# Patient Record
Sex: Female | Born: 1995 | Race: White | Hispanic: No | Marital: Single | Smoking: Former smoker
Health system: Southern US, Community
[De-identification: ages and names within clinical notes are randomized; demographics above are authoritative.]

## PROBLEM LIST (undated history)

## (undated) DIAGNOSIS — N83209 Unspecified ovarian cyst, unspecified side: Secondary | ICD-10-CM

## (undated) HISTORY — DX: Unspecified ovarian cyst, unspecified side: N83.209

---
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
## 2016-05-15 ENCOUNTER — Emergency Department
Admission: EM | Admit: 2016-05-15 | Discharge: 2016-05-15 | Disposition: A | Discharge status: Home / Self Care | Payer: Self-pay | Attending: Emergency Medicine | Admitting: Emergency Medicine

## 2016-05-15 ENCOUNTER — Encounter: Payer: Self-pay | Admitting: Emergency Medicine

## 2016-05-15 ENCOUNTER — Emergency Department: Payer: Self-pay

## 2016-05-15 DIAGNOSIS — N3001 Acute cystitis with hematuria: Secondary | ICD-10-CM | POA: Insufficient documentation

## 2016-05-15 DIAGNOSIS — F17200 Nicotine dependence, unspecified, uncomplicated: Secondary | ICD-10-CM | POA: Insufficient documentation

## 2016-05-15 LAB — URINALYSIS, COMPLETE (UACMP) WITH MICROSCOPIC
Bilirubin Urine: NEGATIVE
Glucose, UA: NEGATIVE mg/dL
Ketones, ur: NEGATIVE mg/dL
Nitrite: NEGATIVE
PH: 5 (ref 5.0–8.0)
Protein, ur: 100 mg/dL — AB
SPECIFIC GRAVITY, URINE: 1.025 (ref 1.005–1.030)

## 2016-05-15 MED ORDER — PHENAZOPYRIDINE HCL 100 MG PO TABS: 100.0000 mg | ORAL_TABLET | Freq: Three times a day (TID) | ORAL | 0 refills | Status: DC | PRN

## 2016-05-15 MED ORDER — CEPHALEXIN 500 MG PO CAPS
500.0000 mg | ORAL_CAPSULE | Freq: Once | ORAL | Status: AC
  Administered 2016-05-15: 500 mg via ORAL
  Filled 2016-05-15: qty 1

## 2016-05-15 MED ORDER — PHENAZOPYRIDINE HCL 100 MG PO TABS
95.0000 mg | ORAL_TABLET | Freq: Once | ORAL | Status: AC
  Administered 2016-05-15: 100 mg via ORAL
  Filled 2016-05-15: qty 1

## 2016-05-15 MED ORDER — CEPHALEXIN 500 MG PO CAPS: 500.0000 mg | ORAL_CAPSULE | Freq: Two times a day (BID) | ORAL | 0 refills | Status: AC

## 2016-05-15 NOTE — ED Notes (Signed)

## 2016-05-15 NOTE — ED Provider Notes (Signed)
[facility] Emergency Department Provider Note   First MD Initiated Contact with Patient 05/15/16 [phone]     (approximate)  I have reviewed the triage vital signs and the nursing notes.   HISTORY  Chief Complaint Hematuria    HPI Donna Bryant is a 21 y.o. female presents with acute onset of dysuria and hematuria which started at 8 PM last night. Patient denies any fever or febrile on presentation temperature 98. Patient denies any vomiting or diarrhea. Patient denies any flank pain and lower abdominal.   Past medical history No pertinent past medical history There are no active problems to display for this patient.   Past Surgical History:  Procedure Laterality Date  . ABDOMINAL HYSTERECTOMY      Prior to Admission medications   Medication Sig Start Date End Date Taking? Authorizing Provider  cephALEXin (KEFLEX) 500 MG capsule Take 1 capsule (500 mg total) by mouth 2 (two) times daily. 05/15/16 05/25/16  Darci Current, MD  phenazopyridine (PYRIDIUM) 100 MG tablet Take 1 tablet (100 mg total) by mouth 3 (three) times daily as needed for pain. 05/15/16   Darci Current, MD    Allergies Patient has no known allergies.  No family history on file.  Social History Social History  Substance Use Topics  . Smoking status: Current Every Day Smoker  . Smokeless tobacco: Never Used  . Alcohol use No    Review of Systems Constitutional: No fever/chills Eyes: No visual changes. ENT: No sore throat. Cardiovascular: Denies chest pain. Respiratory: Denies shortness of breath. Gastrointestinal: No abdominal pain.  No nausea, no vomiting.  No diarrhea.  No constipation. Genitourinary: Positive for hematuria and dysuria Musculoskeletal: Negative for back pain. Skin: Negative for rash. Neurological: Negative for headaches, focal weakness or numbness.  10-point ROS otherwise negative.  ____________________________________________   PHYSICAL  EXAM:  VITAL SIGNS: ED Triage Vitals  Enc Vitals Group     BP 05/15/16 0219 109/68     Pulse Rate 05/15/16 0219 (!) 108     Resp 05/15/16 0219 18     Temp 05/15/16 0219 98 F (36.7 C)     Temp Source 05/15/16 0219 Oral     SpO2 05/15/16 0219 100 %     Weight 05/15/16 0214 140 lb (63.5 kg)     Height 05/15/16 0214 5\' 8"  (1.727 m)     Head Circumference --      Peak Flow --      Pain Score --      Pain Loc --      Pain Edu? --      Excl. in GC? --     Constitutional: Alert and oriented. Well appearing and in no acute distress. Eyes: Conjunctivae are normal. PERRL. EOMI. Head: Atraumatic. Neck: No stridor. Cardiovascular: Normal rate, regular rhythm. Good peripheral circulation. Grossly normal heart sounds. Respiratory: Normal respiratory effort.  No retractions. Lungs CTAB. Gastrointestinal: Soft and nontender. No distention.  Musculoskeletal: No lower extremity tenderness nor edema. No gross deformities of extremities. Neurologic:  Normal speech and language. No gross focal neurologic deficits are appreciated.  Skin:  Skin is warm, dry and intact. No rash noted. Psychiatric: Mood and affect are normal. Speech and behavior are normal.  ____________________________________________   LABS (all labs ordered are listed, but only abnormal results are displayed)  Labs Reviewed  URINALYSIS, COMPLETE (UACMP) WITH MICROSCOPIC - Abnormal; Notable for the following:       Result Value   Color, Urine AMBER (*)  APPearance CLOUDY (*)    Hgb urine dipstick LARGE (*)    Protein, ur 100 (*)    Leukocytes, UA MODERATE (*)    Bacteria, UA RARE (*)    Squamous Epithelial / LPF 0-5 (*)    All other components within normal limits     RADIOLOGY I, [place] N Juandedios Dudash, personally viewed and evaluated these images (plain radiographs) as part of my medical decision making, as well as reviewing the written report by the radiologist.  Dg Abdomen 1 View  Result Date: 05/15/2016 CLINICAL  DATA:  Nausea and vomiting EXAM: ABDOMEN - 1 VIEW COMPARISON:  None. FINDINGS: Nonobstructed bowel-gas pattern with moderate stool. No abnormal calcifications. IMPRESSION: Nonobstructed bowel-gas pattern Electronically Signed   By: Jasmine PangKim  Fujinaga M.D.   On: 05/15/2016 03:55     Procedures     INITIAL IMPRESSION / ASSESSMENT AND PLAN / ED COURSE  Pertinent labs & imaging results that were available during my care of the patient were reviewed by me and considered in my medical decision making (see chart for details).        ____________________________________________  FINAL CLINICAL IMPRESSION(S) / ED DIAGNOSES  Final diagnoses:  Acute cystitis with hematuria     MEDICATIONS GIVEN DURING THIS VISIT:  Medications  phenazopyridine (PYRIDIUM) tablet 100 mg (not administered)  cephALEXin (KEFLEX) capsule 500 mg (not administered)     NEW OUTPATIENT MEDICATIONS STARTED DURING THIS VISIT:  New Prescriptions   CEPHALEXIN (KEFLEX) 500 MG CAPSULE    Take 1 capsule (500 mg total) by mouth 2 (two) times daily.   PHENAZOPYRIDINE (PYRIDIUM) 100 MG TABLET    Take 1 tablet (100 mg total) by mouth 3 (three) times daily as needed for pain.    Modified Medications   No medications on file    Discontinued Medications   No medications on file     Note:  This document was prepared using Dragon voice recognition software and may include unintentional dictation errors.    Darci Currentandolph N Clara Herbison, MD 05/15/16 [phone]

## 2016-05-15 NOTE — ED Notes (Signed)
Pt. returned from XR. 

## 2016-05-15 NOTE — ED Triage Notes (Signed)
Patient ambulatory to triage with steady gait, without difficulty or distress noted; pt reports hematuria and dysuria since 845pm; denies abd/back pain

## 2017-12-24 ENCOUNTER — Emergency Department: Payer: Medicaid Other

## 2017-12-24 ENCOUNTER — Encounter: Payer: Self-pay | Admitting: Emergency Medicine

## 2017-12-24 ENCOUNTER — Other Ambulatory Visit: Payer: Self-pay

## 2017-12-24 DIAGNOSIS — Z87891 Personal history of nicotine dependence: Secondary | ICD-10-CM | POA: Diagnosis not present

## 2017-12-24 DIAGNOSIS — N83201 Unspecified ovarian cyst, right side: Secondary | ICD-10-CM | POA: Diagnosis not present

## 2017-12-24 DIAGNOSIS — R102 Pelvic and perineal pain: Secondary | ICD-10-CM | POA: Diagnosis present

## 2017-12-24 LAB — COMPREHENSIVE METABOLIC PANEL
ALBUMIN: 4.1 g/dL (ref 3.5–5.0)
ALT: 16 U/L (ref 0–44)
AST: 19 U/L (ref 15–41)
Alkaline Phosphatase: 59 U/L (ref 38–126)
Anion gap: 6 (ref 5–15)
BUN: 15 mg/dL (ref 6–20)
CHLORIDE: 108 mmol/L (ref 98–111)
CO2: 25 mmol/L (ref 22–32)
CREATININE: 0.87 mg/dL (ref 0.44–1.00)
Calcium: 8.9 mg/dL (ref 8.9–10.3)
GFR calc Af Amer: >60 mL/min (ref >60)
GLUCOSE: 94 mg/dL (ref 70–99)
POTASSIUM: 4 mmol/L (ref 3.5–5.1)
Sodium: 139 mmol/L (ref 135–145)
Total Bilirubin: 0.4 mg/dL (ref 0.3–1.2)
Total Protein: 6.7 g/dL (ref 6.5–8.1)

## 2017-12-24 LAB — URINALYSIS, COMPLETE (UACMP) WITH MICROSCOPIC
Bilirubin Urine: NEGATIVE
GLUCOSE, UA: NEGATIVE mg/dL
HGB URINE DIPSTICK: NEGATIVE
KETONES UR: NEGATIVE mg/dL
LEUKOCYTES UA: NEGATIVE
Nitrite: NEGATIVE
PROTEIN: NEGATIVE mg/dL
Specific Gravity, Urine: 1.02 (ref 1.005–1.030)
pH: 6 (ref 5.0–8.0)

## 2017-12-24 LAB — CBC WITH DIFFERENTIAL/PLATELET
BASOS ABS: 0 10*3/uL (ref 0–0.1)
Basophils Relative: 1 %
EOS PCT: 3 %
Eosinophils Absolute: 0.2 10*3/uL (ref 0–0.7)
HEMATOCRIT: 38.4 % (ref 35.0–47.0)
Hemoglobin: 13.2 g/dL (ref 12.0–16.0)
LYMPHS PCT: 22 %
Lymphs Abs: 1.6 10*3/uL (ref 1.0–3.6)
MCH: 31.2 pg (ref 26.0–34.0)
MCHC: 34.5 g/dL (ref 32.0–36.0)
MCV: 90.4 fL (ref 80.0–100.0)
Monocytes Absolute: 0.4 10*3/uL (ref 0.2–0.9)
Monocytes Relative: 6 %
Neutro Abs: 5.1 10*3/uL (ref 1.4–6.5)
Neutrophils Relative %: 68 %
PLATELETS: 199 10*3/uL (ref 150–440)
RBC: 4.25 MIL/uL (ref 3.80–5.20)
RDW: 13.5 % (ref 11.5–14.5)
WBC: 7.3 10*3/uL (ref 3.6–11.0)

## 2017-12-24 LAB — POCT PREGNANCY, URINE: PREG TEST UR: NEGATIVE

## 2017-12-24 NOTE — ED Triage Notes (Addendum)
Patient ambulatory to triage with steady gait, without difficulty or distress noted; pt reports pelvic pain tonight with no accomp symptoms; st recently sexual intercourse has been painful and pain began tonight after intercourse; denies vag bleeding or discharge however reports last several months has been having brief monthly periods despite having a partial hysterectomy; tenderness & grimacing with palpation to lower abd, pt reports increased pain with ambulation or movement

## 2017-12-25 ENCOUNTER — Emergency Department
Admission: EM | Admit: 2017-12-25 | Discharge: 2017-12-25 | Disposition: A | Discharge status: Home / Self Care | Payer: Medicaid Other | Attending: Emergency Medicine | Admitting: Emergency Medicine

## 2017-12-25 DIAGNOSIS — N83201 Unspecified ovarian cyst, right side: Secondary | ICD-10-CM

## 2017-12-25 DIAGNOSIS — R102 Pelvic and perineal pain: Secondary | ICD-10-CM

## 2017-12-25 LAB — CHLAMYDIA/NGC RT PCR (ARMC ONLY)
Chlamydia Tr: NOT DETECTED
N gonorrhoeae: NOT DETECTED

## 2017-12-25 LAB — WET PREP, GENITAL
CLUE CELLS WET PREP: NONE SEEN
SPERM: NONE SEEN
Trich, Wet Prep: NONE SEEN
Yeast Wet Prep HPF POC: NONE SEEN

## 2017-12-25 MED ORDER — OXYCODONE-ACETAMINOPHEN 5-325 MG PO TABS: 1.0000 | ORAL_TABLET | ORAL | 0 refills | Status: DC | PRN

## 2017-12-25 MED ORDER — OXYCODONE-ACETAMINOPHEN 5-325 MG PO TABS
1.0000 | ORAL_TABLET | Freq: Once | ORAL | Status: AC
  Administered 2017-12-25: 1 via ORAL
  Filled 2017-12-25: qty 1

## 2017-12-25 NOTE — ED Provider Notes (Signed)
[facility] Emergency Department Provider Note ___________________________   First MD Initiated Contact with Patient 12/25/17 [phone]     (approximate)  I have reviewed the triage vital signs and the nursing notes.   HISTORY  Chief Complaint No chief complaint on file.    HPI Donna Bryant is a 22 y.o. female with history of partial hysterectomy approximately 3 and half years ago presents to the emergency department with 8 out of 10 pelvic discomfort which began during sexual intercourse tonight.  Patient states that she has had painful sexual intercourse recently.  Patient denies any dysuria no vaginal discharge.  Patient does admit to monthly brief periods over the past several months despite the fact that she has had a partial hysterectomy.   Past medical history No chronic medical conditions.  There are no active problems to display for this patient.   Past Surgical History:  Procedure Laterality Date  . ABDOMINAL HYSTERECTOMY      Prior to Admission medications   Medication Sig Start Date End Date Taking? Authorizing Provider  phenazopyridine (PYRIDIUM) 100 MG tablet Take 1 tablet (100 mg total) by mouth 3 (three) times daily as needed for pain. 05/15/16   Darci Current, MD    Allergies No known drug allergies No family history on file.  Social History Social History   Tobacco Use  . Smoking status: Former Games developer  . Smokeless tobacco: Never Used  Substance Use Topics  . Alcohol use: No  . Drug use: Not on file    Review of Systems Constitutional: No fever/chills Eyes: No visual changes. ENT: No sore throat. Cardiovascular: Denies chest pain. Respiratory: Denies shortness of breath. Gastrointestinal: No abdominal pain.  No nausea, no vomiting.  No diarrhea.  No constipation. Genitourinary: Negative for dysuria.  Positive for pelvic pain Musculoskeletal: Negative for neck pain.  Negative for back pain. Integumentary: Negative for  rash. Neurological: Negative for headaches, focal weakness or numbness.  ____________________________________________   PHYSICAL EXAM:  VITAL SIGNS: ED Triage Vitals [12/24/17 2142]  Enc Vitals Group     BP 108/66     Pulse Rate 79     Resp 18     Temp 97.8 F (36.6 C)     Temp Source Oral     SpO2 100 %     Weight 63.5 kg (140 lb)     Height 1.727 m (5\' 8" )     Head Circumference      Peak Flow      Pain Score 6     Pain Loc      Pain Edu?      Excl. in GC?     Constitutional: Alert and oriented. Well appearing and in no acute distress. Eyes: Conjunctivae are normal. Head: Atraumatic. Mouth/Throat: Mucous membranes are moist.  Oropharynx non-erythematous. Neck: No stridor.   Cardiovascular: Normal rate, regular rhythm. Good peripheral circulation. Grossly normal heart sounds. Respiratory: Normal respiratory effort.  No retractions. Lungs CTAB. Gastrointestinal: Soft and nontender. No distention.  Musculoskeletal: No lower extremity tenderness nor edema. No gross deformities of extremities. Neurologic:  Normal speech and language. No gross focal neurologic deficits are appreciated.  Skin:  Skin is warm, dry and intact. No rash noted. Psychiatric: Mood and affect are normal. Speech and behavior are normal.  ____________________________________________   LABS (all labs ordered are listed, but only abnormal results are displayed)  Labs Reviewed  URINALYSIS, COMPLETE (UACMP) WITH MICROSCOPIC - Abnormal; Notable for the following components:  Result Value   Color, Urine YELLOW (*)    APPearance CLEAR (*)    Bacteria, UA RARE (*)    All other components within normal limits  CBC WITH DIFFERENTIAL/PLATELET  COMPREHENSIVE METABOLIC PANEL  POCT PREGNANCY, URINE     RADIOLOGY I, Raymond N Aurther Harlin, personally viewed and evaluated these images (plain radiographs) as part of my medical decision making, as well as reviewing the written report by the radiologist.  ED  MD interpretation: 3.9 cm right ovarian possible hemorrhagic cyst.  Official radiology report(s): US Pelvis Transvanginal Non-ob (tv Only)  Result Date: 12/24/2017 CLINICAL DATA:  Pelvic pain after intercourse EXAM: TRANSABDOMINAL AND TRANSVAGINAL ULTRASOUND OF PELVIS TECHNIQUE: Both transabdominal and transvaginal ultrasound examinations of the pelvis were performed. Transabdominal technique was performed for global imaging of the pelvis including uterus, ovaries, adnexal regions, and pelvic cul-de-sac. It was necessary to proceed with endovaginal exam following the transabdominal exam to visualize the adnexa. COMPARISON:  None FINDINGS: Uterus Status post hysterectomy Endometrium Status post hysterectomy Right ovary Measurements: 7.1 x 4.6 x 5.1 cm. Cysts in the right ovary measuring 3.9 x 3.6 x 3.7 cm and 3.4 x 2.9 x 3.7 cm. Flow present within the right ovary. Left ovary Measurements: 2.8 x 2 x 2 cm.  Normal appearance/no adnexal mass. Other findings Small amount of free fluid in the pelvis IMPRESSION: 1. Status post hysterectomy 2. Complex cysts in the right ovary measuring up to 3.9 cm, possibly hemorrhagic. Recommend 6-12 week sonographic follow-up to ensure resolution 3. Small amount of free fluid in the pelvis Electronically Signed   By: Jasmine Pang M.D.   On: 12/24/2017 23:33   US Pelvis Complete  Result Date: 12/24/2017 CLINICAL DATA:  Pelvic pain after intercourse EXAM: TRANSABDOMINAL AND TRANSVAGINAL ULTRASOUND OF PELVIS TECHNIQUE: Both transabdominal and transvaginal ultrasound examinations of the pelvis were performed. Transabdominal technique was performed for global imaging of the pelvis including uterus, ovaries, adnexal regions, and pelvic cul-de-sac. It was necessary to proceed with endovaginal exam following the transabdominal exam to visualize the adnexa. COMPARISON:  None FINDINGS: Uterus Status post hysterectomy Endometrium Status post hysterectomy Right ovary Measurements: 7.1 x  4.6 x 5.1 cm. Cysts in the right ovary measuring 3.9 x 3.6 x 3.7 cm and 3.4 x 2.9 x 3.7 cm. Flow present within the right ovary. Left ovary Measurements: 2.8 x 2 x 2 cm.  Normal appearance/no adnexal mass. Other findings Small amount of free fluid in the pelvis IMPRESSION: 1. Status post hysterectomy 2. Complex cysts in the right ovary measuring up to 3.9 cm, possibly hemorrhagic. Recommend 6-12 week sonographic follow-up to ensure resolution 3. Small amount of free fluid in the pelvis Electronically Signed   By: Jasmine Pang M.D.   On: 12/24/2017 23:33    ____________________  Procedures   ____________________________________________   INITIAL IMPRESSION / ASSESSMENT AND PLAN / ED COURSE  As part of my medical decision making, I reviewed the following data within the electronic MEDICAL RECORD NUMBER    22 year old female presenting with above-stated history and physical exam secondary to pelvic pain.  Pelvic ultrasound revealed evidence of an ovarian cyst that is possibly hemorrhagic.  Vaginal exam revealed no gross abnormality cultures obtained and pending.  Patient does have both of her children with her and is not willing to stay till vaginal swabs resulted.  Patient given [facility] emergency department will be prescribed same for home with recommendation to follow-up with Dr. Valentino Saxon. ____________________________________________  FINAL CLINICAL IMPRESSION(S) / ED DIAGNOSES  Final  diagnoses:  Right ovarian cyst     MEDICATIONS GIVEN DURING THIS VISIT:  Medications  oxyCODONE-acetaminophen (PERCOCET/ROXICET) 5-325 MG per tablet 1 tablet (1 tablet Oral Given 12/25/17 0053)     ED Discharge Orders    None       Note:  This document was prepared using Dragon voice recognition software and may include unintentional dictation errors.    Darci Current, MD 12/25/17 [phone]

## 2017-12-25 NOTE — ED Notes (Signed)
ED Provider at bedside. 

## 2017-12-25 NOTE — ED Notes (Signed)
Pt states she is feeling better.  Pt alert.  Family with pt

## 2017-12-25 NOTE — ED Notes (Signed)
Pelvic exam done  Specimen to lab.  

## 2017-12-31 ENCOUNTER — Ambulatory Visit (INDEPENDENT_AMBULATORY_CARE_PROVIDER_SITE_OTHER): Payer: Medicaid Other | Admitting: Obstetrics and Gynecology

## 2017-12-31 ENCOUNTER — Encounter: Payer: Self-pay | Admitting: Obstetrics and Gynecology

## 2017-12-31 VITALS — BP 103/67 | HR 66 | Ht 68.0 in | Wt 142.3 lb

## 2017-12-31 DIAGNOSIS — N83201 Unspecified ovarian cyst, right side: Secondary | ICD-10-CM | POA: Diagnosis not present

## 2017-12-31 DIAGNOSIS — R102 Pelvic and perineal pain: Secondary | ICD-10-CM

## 2017-12-31 DIAGNOSIS — N83209 Unspecified ovarian cyst, unspecified side: Secondary | ICD-10-CM

## 2017-12-31 NOTE — Progress Notes (Signed)
HPI:      [name] is a 22 y.o. G1P1 who LMP was No LMP recorded. Patient has had a hysterectomy.  Subjective:   She presents today for follow-up of an ovarian cyst which appears to be a hemorrhagic cyst (X2).  She has intermittent pelvic pain.  The pain is not disabling.  She has been taking Aleve which provides her with some relief. Significant history includes a history of cesarean hysterectomy.  The hysterectomy was supracervical and the patient has approximately 1 day of spotting per month. She has never had cyst like this before.    Hx: The following portions of the patient's history were reviewed and updated as appropriate:             She  has a past medical history of Ovarian cyst. She does not have a problem list on file. She  has a past surgical history that includes Abdominal hysterectomy (2016) and Cesarean section. Her family history is not on file. She  reports that she has quit smoking. She has never used smokeless tobacco. She reports that she drinks alcohol. She reports that she does not use drugs. She has a current medication list which includes the following prescription(s): oxycodone-acetaminophen. She has No Known Allergies.       Review of Systems:  Review of Systems  Constitutional: Denied constitutional symptoms, night sweats, recent illness, fatigue, fever, insomnia and weight loss.  Eyes: Denied eye symptoms, eye pain, photophobia, vision change and visual disturbance.  Ears/Nose/Throat/Neck: Denied ear, nose, throat or neck symptoms, hearing loss, nasal discharge, sinus congestion and sore throat.  Cardiovascular: Denied cardiovascular symptoms, arrhythmia, chest pain/pressure, edema, exercise intolerance, orthopnea and palpitations.  Respiratory: Denied pulmonary symptoms, asthma, pleuritic pain, productive sputum, cough, dyspnea and wheezing.  Gastrointestinal: Denied, gastro-esophageal reflux, melena, nausea and vomiting.  Genitourinary: See HPI for  additional information.  Musculoskeletal: Denied musculoskeletal symptoms, stiffness, swelling, muscle weakness and myalgia.  Dermatologic: Denied dermatology symptoms, rash and scar.  Neurologic: Denied neurology symptoms, dizziness, headache, neck pain and syncope.  Psychiatric: Denied psychiatric symptoms, anxiety and depression.  Endocrine: Denied endocrine symptoms including hot flashes and night sweats.   Meds:   Current Outpatient Medications on File Prior to Visit  Medication Sig Dispense Refill  . oxyCODONE-acetaminophen (PERCOCET) 5-325 MG tablet Take 1 tablet by mouth every 4 (four) hours as needed for severe pain. 20 tablet 0   No current facility-administered medications on file prior to visit.     Objective:     Vitals:   12/31/17 1536  BP: 103/67  Pulse: 66              Ultrasound results reviewed in detail with the patient.  Assessment:    G1P1 There are no active problems to display for this patient.    1. Pelvic pain in female   2. Cyst of ovary, unspecified laterality     Likely hemorrhagic cyst-benign.   Plan:            1.  Expectant management.  Expect resolution of the cysts.  Patient contact us if her pain worsens.  Follow-up ultrasound in 6 weeks.  I have discussed the possibility of OCPs for prevention of future cysts.  Patient will consider this option. Orders Orders Placed This Encounter  Procedures  . US PELVIS TRANSVANGINAL NON-OB (TV ONLY)    No orders of the defined types were placed in this encounter.     F/U  Return in about 6 weeks (  around 02/11/2018). I spent 22 minutes involved in the care of this patient of which greater than 50% was spent discussing ultrasound findings of ovarian cysts, different types of ovarian cysts including hemorrhagic, vitreous cyst, menstrual cycles despite hysterectomy, prevention of ovarian cyst with OCPs, follow-up for ovarian cysts, pain relief with ovarian cyst.  All questions answered.  Elonda Husky, M.D. 12/31/2017 4:06 PM

## 2018-01-20 ENCOUNTER — Telehealth: Payer: Self-pay | Admitting: Obstetrics and Gynecology

## 2018-01-20 NOTE — Telephone Encounter (Signed)
Th patient called and stated that she would like to have her nurse call her back in regards to sending a note to her employer. The patient did not disclose any other information, Please advise.

## 2018-01-20 NOTE — Telephone Encounter (Signed)
Returned Pt call. Pt requested a note to work authorizing her more frequent bathroom breaks during her work shifts due to her ovarian cyst. I complied and faxed a noted to her employer.

## 2018-02-11 ENCOUNTER — Ambulatory Visit (INDEPENDENT_AMBULATORY_CARE_PROVIDER_SITE_OTHER): Payer: Medicaid Other

## 2018-02-11 ENCOUNTER — Encounter: Payer: Self-pay | Admitting: Obstetrics and Gynecology

## 2018-02-11 ENCOUNTER — Ambulatory Visit (INDEPENDENT_AMBULATORY_CARE_PROVIDER_SITE_OTHER): Payer: Medicaid Other | Admitting: Obstetrics and Gynecology

## 2018-02-11 ENCOUNTER — Other Ambulatory Visit: Payer: Medicaid Other

## 2018-02-11 ENCOUNTER — Encounter: Payer: Medicaid Other | Admitting: Obstetrics and Gynecology

## 2018-02-11 VITALS — BP 109/68 | HR 102 | Ht 68.0 in | Wt 146.0 lb

## 2018-02-11 DIAGNOSIS — N83209 Unspecified ovarian cyst, unspecified side: Secondary | ICD-10-CM | POA: Diagnosis not present

## 2018-02-11 DIAGNOSIS — N83201 Unspecified ovarian cyst, right side: Secondary | ICD-10-CM | POA: Diagnosis not present

## 2018-02-11 DIAGNOSIS — Z30011 Encounter for initial prescription of contraceptive pills: Secondary | ICD-10-CM | POA: Diagnosis not present

## 2018-02-11 DIAGNOSIS — R102 Pelvic and perineal pain: Secondary | ICD-10-CM | POA: Diagnosis not present

## 2018-02-11 MED ORDER — LEVONORGEST-ETH ESTRAD 91-DAY 0.15-0.03 &0.01 MG PO TABS
1.0000 | ORAL_TABLET | Freq: Every day | ORAL | 1 refills | Status: AC
Start: 2018-02-11 — End: ?

## 2018-02-11 NOTE — Progress Notes (Signed)
Pt presents today to discuss ovarian cyst after ultrasound.

## 2018-02-11 NOTE — Progress Notes (Signed)
HPI:      Ms. Donna DeutscherReba Bryant is a 22 y.o. G1P1 who LMP was No LMP recorded. Patient has had a hysterectomy.  Subjective:   She presents today for follow-up of ovarian cyst.  She reports that her pain has improved although she continues to have some pain with bowel movements urination and intercourse it is not nearly as bad as it used to be. Of significant note patient has previously had a supracervical hysterectomy. She had an ultrasound performed today.    Hx: The following portions of the patient's history were reviewed and updated as appropriate:             She  has a past medical history of Ovarian cyst. She does not have a problem list on file. She  has a past surgical history that includes Abdominal hysterectomy (2016) and Cesarean section. Her family history is not on file. She  reports that she has quit smoking. She has never used smokeless tobacco. She reports that she drinks alcohol. She reports that she does not use drugs. She has a current medication list which includes the following prescription(s): levonorgestrel-ethinyl estradiol. She has No Known Allergies.       Review of Systems:  Review of Systems  Constitutional: Denied constitutional symptoms, night sweats, recent illness, fatigue, fever, insomnia and weight loss.  Eyes: Denied eye symptoms, eye pain, photophobia, vision change and visual disturbance.  Ears/Nose/Throat/Neck: Denied ear, nose, throat or neck symptoms, hearing loss, nasal discharge, sinus congestion and sore throat.  Cardiovascular: Denied cardiovascular symptoms, arrhythmia, chest pain/pressure, edema, exercise intolerance, orthopnea and palpitations.  Respiratory: Denied pulmonary symptoms, asthma, pleuritic pain, productive sputum, cough, dyspnea and wheezing.  Gastrointestinal: Denied, gastro-esophageal reflux, melena, nausea and vomiting.  Genitourinary: See HPI for additional information.  Musculoskeletal: Denied musculoskeletal symptoms,  stiffness, swelling, muscle weakness and myalgia.  Dermatologic: Denied dermatology symptoms, rash and scar.  Neurologic: Denied neurology symptoms, dizziness, headache, neck pain and syncope.  Psychiatric: Denied psychiatric symptoms, anxiety and depression.  Endocrine: Denied endocrine symptoms including hot flashes and night sweats.   Meds:   No current outpatient medications on file prior to visit.   No current facility-administered medications on file prior to visit.     Objective:     Vitals:   02/11/18 1032  BP: 109/68  Pulse: (!) 102              Ultrasound results reviewed directly with the patient.  See report  Assessment:    G1P1 There are no active problems to display for this patient.    1. Pelvic pain in female   2. Cyst of ovary, unspecified laterality   3. Initiation of OCP (BCP)     Pain is improving, cyst is decreasing in size thus effectively ruling out benign ovarian tumors and ovarian malignancy.  The primary diagnosis remains hemorrhagic ovarian cyst slowly resolving.  Patient would like to start OCPs to prevent any future ovarian cyst.   Plan:            1.  Expectant management of her pain and the cyst.  I expect complete resolution.  The cyst is quite small at this time.  2.  OCPs The risks /benefits of OCPs have been explained to the patient in detail.  Product literature has been given to her.  I have instructed her in the use of OCPs and have given her literature reinforcing this information.  I have explained to the patient that OCPs are not as  effective for birth control during the first month of use, and that another form of contraception should be used during this time.  Both first-day start and Sunday start have been explained.  The risks and benefits of each was discussed.  She has been made aware of  the fact that other medications may affect the efficacy of OCPs.  I have answered all of her questions, and I believe that she has an  understanding of the effectiveness and use of OCPs.  Orders No orders of the defined types were placed in this encounter.    Meds ordered this encounter  Medications  . Levonorgestrel-Ethinyl Estradiol (AMETHIA,CAMRESE) 0.15-0.03 &0.01 MG tablet    Sig: Take 1 tablet by mouth at bedtime.    Dispense:  84 tablet    Refill:  1      F/U  Return in about 3 months (around 05/14/2018).  Elonda Husky, M.D. 02/11/2018 11:43 AM

## 2018-03-02 ENCOUNTER — Other Ambulatory Visit: Payer: Self-pay

## 2018-03-02 ENCOUNTER — Emergency Department
Admission: EM | Admit: 2018-03-02 | Discharge: 2018-03-02 | Disposition: A | Discharge status: Home / Self Care | Payer: Medicaid Other | Attending: Emergency Medicine | Admitting: Emergency Medicine

## 2018-03-02 DIAGNOSIS — K5289 Other specified noninfective gastroenteritis and colitis: Secondary | ICD-10-CM | POA: Insufficient documentation

## 2018-03-02 DIAGNOSIS — K529 Noninfective gastroenteritis and colitis, unspecified: Secondary | ICD-10-CM

## 2018-03-02 DIAGNOSIS — Z87891 Personal history of nicotine dependence: Secondary | ICD-10-CM | POA: Insufficient documentation

## 2018-03-02 DIAGNOSIS — R112 Nausea with vomiting, unspecified: Secondary | ICD-10-CM | POA: Diagnosis present

## 2018-03-02 LAB — CBC
HEMATOCRIT: 36.7 % (ref 36.0–46.0)
HEMOGLOBIN: 12.1 g/dL (ref 12.0–15.0)
MCH: 30 pg (ref 26.0–34.0)
MCHC: 33 g/dL (ref 30.0–36.0)
MCV: 90.8 fL (ref 80.0–100.0)
Platelets: 209 10*3/uL (ref 150–400)
RBC: 4.04 MIL/uL (ref 3.87–5.11)
RDW: 12.4 % (ref 11.5–15.5)
WBC: 7.9 10*3/uL (ref 4.0–10.5)
nRBC: 0 % (ref 0.0–0.2)

## 2018-03-02 LAB — URINALYSIS, COMPLETE (UACMP) WITH MICROSCOPIC
BILIRUBIN URINE: NEGATIVE
Glucose, UA: NEGATIVE mg/dL
HGB URINE DIPSTICK: NEGATIVE
KETONES UR: 5 mg/dL — AB
NITRITE: NEGATIVE
PROTEIN: 100 mg/dL — AB
Specific Gravity, Urine: 1.02 (ref 1.005–1.030)
pH: 5 (ref 5.0–8.0)

## 2018-03-02 LAB — POCT PREGNANCY, URINE: PREG TEST UR: NEGATIVE

## 2018-03-02 LAB — COMPREHENSIVE METABOLIC PANEL
ALBUMIN: 3.6 g/dL (ref 3.5–5.0)
ALT: 18 U/L (ref 0–44)
AST: 18 U/L (ref 15–41)
Alkaline Phosphatase: 61 U/L (ref 38–126)
Anion gap: 10 (ref 5–15)
BILIRUBIN TOTAL: 0.5 mg/dL (ref 0.3–1.2)
BUN: 10 mg/dL (ref 6–20)
CHLORIDE: 98 mmol/L (ref 98–111)
CO2: 26 mmol/L (ref 22–32)
Calcium: 8.8 mg/dL — ABNORMAL LOW (ref 8.9–10.3)
Creatinine, Ser: 0.92 mg/dL (ref 0.44–1.00)
GFR calc Af Amer: >60 mL/min (ref >60)
GFR calc non Af Amer: >60 mL/min (ref >60)
GLUCOSE: 110 mg/dL — AB (ref 70–99)
POTASSIUM: 3 mmol/L — AB (ref 3.5–5.1)
SODIUM: 134 mmol/L — AB (ref 135–145)
Total Protein: 7.5 g/dL (ref 6.5–8.1)

## 2018-03-02 LAB — LIPASE, BLOOD: LIPASE: 29 U/L (ref 11–51)

## 2018-03-02 MED ORDER — ONDANSETRON 4 MG PO TBDP: 4.0000 mg | ORAL_TABLET | Freq: Three times a day (TID) | ORAL | 0 refills | Status: AC | PRN

## 2018-03-02 MED ORDER — ONDANSETRON HCL 4 MG/2ML IJ SOLN
4.0000 mg | Freq: Once | INTRAMUSCULAR | Status: AC
  Administered 2018-03-02: 4 mg via INTRAVENOUS
  Filled 2018-03-02: qty 2

## 2018-03-02 MED ORDER — SODIUM CHLORIDE 0.9 % IV SOLN
1000.0000 mL | Freq: Once | INTRAVENOUS | Status: AC
  Administered 2018-03-02: 1000 mL via INTRAVENOUS

## 2018-03-02 NOTE — ED Triage Notes (Addendum)
Pt arrives to ED via POV from home with c/o fever and vomiting x5 days. Pt denies CP or SHOB, no abdominal pain. Pt reports 3 episodes of emesis and 2 episodes of diarrhea over the last 24 hrs, last temp at home was 100 degrees with temporal thermometer. Last dose of Ibuprofen taken this morning, but unsure of exact time.

## 2018-03-02 NOTE — ED Provider Notes (Signed)
[facility] Emergency Department Provider Note   ____________________________________________    I have reviewed the triage vital signs and the nursing notes.   HISTORY  Chief Complaint Emesis and Fever     HPI Donna Bryant is a 22 y.o. female complains of nausea vomiting diarrhea as well as fever over the last 2 to 3 days.  She reports intermittent abdominal cramping but her primary complaint is nausea and vomiting.  She reports she has not been able to tolerate p.o.'s.  Denies sick contacts.  No recent travel.  Has not taken anything for this.  Past Medical History:  Diagnosis Date  . Ovarian cyst     There are no active problems to display for this patient.   Past Surgical History:  Procedure Laterality Date  . ABDOMINAL HYSTERECTOMY  2016  . CESAREAN SECTION      Prior to Admission medications   Medication Sig Start Date End Date Taking? Authorizing Provider  Levonorgestrel-Ethinyl Estradiol (AMETHIA,CAMRESE) 0.15-0.03 &0.01 MG tablet Take 1 tablet by mouth at bedtime. 02/11/18   Linzie Collin, MD  ondansetron (ZOFRAN ODT) 4 MG disintegrating tablet Take 1 tablet (4 mg total) by mouth every 8 (eight) hours as needed for nausea or vomiting. 03/02/18   Jene Every, MD     Allergies Patient has no known allergies.  Family History  Problem Relation Age of Onset  . Breast cancer Neg Hx   . Ovarian cancer Neg Hx   . Colon cancer Neg Hx     Social History Social History   Tobacco Use  . Smoking status: Former Games developer  . Smokeless tobacco: Never Used  Substance Use Topics  . Alcohol use: Yes    Comment: occas  . Drug use: Never    Review of Systems  Constitutional: No fever/chills Eyes: No visual changes.  ENT: No sore throat. Cardiovascular: Denies chest pain. Respiratory: Denies shortness of breath. Gastrointestinal: As above Genitourinary: Negative for dysuria. Musculoskeletal: Negative for back pain. Skin:  Negative for rash. Neurological: Negative for headaches   ____________________________________________   PHYSICAL EXAM:  VITAL SIGNS: ED Triage Vitals  Enc Vitals Group     BP 03/02/18 2127 (!) 99/58     Pulse Rate 03/02/18 2127 100     Resp 03/02/18 2127 16     Temp 03/02/18 2127 99.5 F (37.5 C)     Temp Source 03/02/18 2127 Oral     SpO2 03/02/18 2127 100 %     Weight 03/02/18 2125 63.5 kg (140 lb)     Height 03/02/18 2125 1.727 m (5\' 8" )     Head Circumference --      Peak Flow --      Pain Score 03/02/18 2125 6     Pain Loc --      Pain Edu? --      Excl. in GC? --     Constitutional: Alert and oriented. No acute distress. Pleasant and interactive Eyes: Conjunctivae are normal.  .Nose: No congestion/rhinnorhea. Mouth/Throat: Mucous membranes are moist.    Cardiovascular: Normal rate, regular rhythm. Grossly normal heart sounds.  Good peripheral circulation. Respiratory: Normal respiratory effort.  No retractions. Lungs CTAB. Gastrointestinal: Soft and nontender. No distention.  No CVA tenderness.  Musculoskeletal: Warm and well perfused Neurologic:  Normal speech and language. No gross focal neurologic deficits are appreciated.  Skin:  Skin is warm, dry and intact. No rash noted. Psychiatric: Mood and affect are normal. Speech and behavior are normal.  ____________________________________________   LABS (all labs ordered are listed, but only abnormal results are displayed)  Labs Reviewed  COMPREHENSIVE METABOLIC PANEL - Abnormal; Notable for the following components:      Result Value   Sodium 134 (*)    Potassium 3.0 (*)    Glucose, Bld 110 (*)    Calcium 8.8 (*)    All other components within normal limits  URINALYSIS, COMPLETE (UACMP) WITH MICROSCOPIC - Abnormal; Notable for the following components:   Color, Urine AMBER (*)    APPearance HAZY (*)    Ketones, ur 5 (*)    Protein, ur 100 (*)    Leukocytes, UA SMALL (*)    WBC, UA >50 (*)     Bacteria, UA RARE (*)    All other components within normal limits  LIPASE, BLOOD  CBC  POC URINE PREG, ED  POCT PREGNANCY, URINE   ____________________________________________  EKG  None ____________________________________________  RADIOLOGY  None ____________________________________________   PROCEDURES  Procedure(s) performed: No  Procedures   Critical Care performed: No ____________________________________________   INITIAL IMPRESSION / ASSESSMENT AND PLAN / ED COURSE  Pertinent labs & imaging results that were available during my care of the patient were reviewed by me and considered in my medical decision making (see chart for details).  Patient presents with nausea vomiting diarrhea highly suspicious for viral gastroenteritis.  Abdominal exam is reassuring.  Lab work is overall unremarkable, will treat with IV fluids, IV Zofran and reevaluate.   ----------------------------------------- 11:03 PM on 03/02/2018 -----------------------------------------  Patient tolerating p.o. now, feels better, appropriate for discharge with outpatient follow-up.  Return precautions discussed   ____________________________________________   FINAL CLINICAL IMPRESSION(S) / ED DIAGNOSES  Final diagnoses:  Gastroenteritis        Note:  This document was prepared using Dragon voice recognition software and may include unintentional dictation errors.    Jene EveryKinner, Antone Summons, MD 03/02/18 2303

## 2018-04-05 ENCOUNTER — Emergency Department (HOSPITAL_COMMUNITY): Payer: Medicaid Other

## 2018-04-05 ENCOUNTER — Emergency Department (HOSPITAL_COMMUNITY)
Admission: EM | Admit: 2018-04-05 | Discharge: 2018-04-05 | Disposition: A | Discharge status: Home / Self Care | Payer: Medicaid Other | Attending: Emergency Medicine | Admitting: Emergency Medicine

## 2018-04-05 ENCOUNTER — Encounter (HOSPITAL_COMMUNITY): Payer: Self-pay

## 2018-04-05 DIAGNOSIS — S42334A Nondisplaced oblique fracture of shaft of humerus, right arm, initial encounter for closed fracture: Secondary | ICD-10-CM | POA: Insufficient documentation

## 2018-04-05 DIAGNOSIS — Y929 Unspecified place or not applicable: Secondary | ICD-10-CM | POA: Insufficient documentation

## 2018-04-05 DIAGNOSIS — Y998 Other external cause status: Secondary | ICD-10-CM | POA: Insufficient documentation

## 2018-04-05 DIAGNOSIS — Z87891 Personal history of nicotine dependence: Secondary | ICD-10-CM | POA: Insufficient documentation

## 2018-04-05 DIAGNOSIS — Y939 Activity, unspecified: Secondary | ICD-10-CM | POA: Diagnosis not present

## 2018-04-05 DIAGNOSIS — Z79899 Other long term (current) drug therapy: Secondary | ICD-10-CM | POA: Diagnosis not present

## 2018-04-05 DIAGNOSIS — W010XXA Fall on same level from slipping, tripping and stumbling without subsequent striking against object, initial encounter: Secondary | ICD-10-CM | POA: Diagnosis not present

## 2018-04-05 DIAGNOSIS — S4991XA Unspecified injury of right shoulder and upper arm, initial encounter: Secondary | ICD-10-CM | POA: Diagnosis present

## 2018-04-05 MED ORDER — HYDROMORPHONE HCL 1 MG/ML IJ SOLN
1.0000 mg | Freq: Once | INTRAMUSCULAR | Status: AC
  Administered 2018-04-05: 1 mg via INTRAVENOUS
  Filled 2018-04-05: qty 1

## 2018-04-05 MED ORDER — FENTANYL CITRATE (PF) 100 MCG/2ML IJ SOLN
50.0000 ug | INTRAMUSCULAR | Status: DC | PRN
  Administered 2018-04-05: 50 ug via INTRAVENOUS
  Filled 2018-04-05: qty 2

## 2018-04-05 MED ORDER — HYDROCODONE-ACETAMINOPHEN 5-325 MG PO TABS: 1.0000 | ORAL_TABLET | ORAL | 0 refills | Status: AC | PRN

## 2018-04-05 MED ORDER — ONDANSETRON HCL 4 MG/2ML IJ SOLN
4.0000 mg | Freq: Once | INTRAMUSCULAR | Status: AC
  Administered 2018-04-05: 4 mg via INTRAVENOUS
  Filled 2018-04-05: qty 2

## 2018-04-05 NOTE — ED Triage Notes (Signed)
Pt states that she slipped and fell onto R arm, deformity to R humerus. Positive PMS.

## 2018-04-05 NOTE — ED Provider Notes (Signed)
[facility] EMERGENCY DEPARTMENT Provider Note   CSN: [id number] Arrival date & time: 04/05/18  0132     History   Chief Complaint Chief Complaint  Patient presents with  . Arm Pain    HPI Donna Bryant is a 23 y.o. female.  Patient presents to the emergency department for evaluation of right arm injury.  Patient reports that she slipped on a wet spot and fell.  She reports that she tried to catch herself with her right arm, injuring it.  Patient complaining of severe pain of the right upper arm.  She did not hit her head or lose consciousness.  No neck or back pain.     Past Medical History:  Diagnosis Date  . Ovarian cyst     There are no active problems to display for this patient.   Past Surgical History:  Procedure Laterality Date  . ABDOMINAL HYSTERECTOMY  2016  . CESAREAN SECTION       OB History    Gravida  1   Para  1   Term      Preterm      AB      Living  1     SAB      TAB      Ectopic      Multiple      Live Births  1            Home Medications    Prior to Admission medications   Medication Sig Start Date End Date Taking? Authorizing Provider  Levonorgestrel-Ethinyl Estradiol (AMETHIA,CAMRESE) 0.15-0.03 &0.01 MG tablet Take 1 tablet by mouth at bedtime. 02/11/18  Yes Linzie Collin, MD  HYDROcodone-acetaminophen (NORCO/VICODIN) 5-325 MG tablet Take 1-2 tablets by mouth every 4 (four) hours as needed for moderate pain. 04/05/18   Gilda Crease, MD  ondansetron (ZOFRAN ODT) 4 MG disintegrating tablet Take 1 tablet (4 mg total) by mouth every 8 (eight) hours as needed for nausea or vomiting. Patient not taking: Reported on 04/05/2018 03/02/18   Jene Every, MD    Family History Family History  Problem Relation Age of Onset  . Breast cancer Neg Hx   . Ovarian cancer Neg Hx   . Colon cancer Neg Hx     Social History Social History   Tobacco Use  . Smoking status: Former Games developer  .  Smokeless tobacco: Never Used  Substance Use Topics  . Alcohol use: Yes    Comment: occas  . Drug use: Never     Allergies   Patient has no known allergies.   Review of Systems Review of Systems  Musculoskeletal:       Arm pain  Neurological: Negative for syncope.     Physical Exam Updated Vital Signs BP 130/84   Pulse 93   Temp 98.2 F (36.8 C) (Oral)   Resp (!) 22   Ht 5\' 8"  (1.727 m)   Wt 65.8 kg   SpO2 100%   BMI 22.05 kg/m   Physical Exam Vitals signs and nursing note reviewed.  Constitutional:      Appearance: Normal appearance.  HENT:     Head: Normocephalic and atraumatic.  Eyes:     Pupils: Pupils are equal, round, and reactive to light.  Neck:     Musculoskeletal: Normal range of motion. No muscular tenderness.  Cardiovascular:     Rate and Rhythm: Normal rate and regular rhythm.     Pulses:  Radial pulses are 2+ on the right side.     Heart sounds: Normal heart sounds.  Pulmonary:     Effort: Pulmonary effort is normal.     Breath sounds: Normal breath sounds.  Abdominal:     Palpations: Abdomen is soft.  Musculoskeletal:     Right elbow: Normal.    Right wrist: Normal.     Right upper arm: She exhibits tenderness and deformity.  Skin:    General: Skin is warm and dry.     Capillary Refill: Capillary refill takes less than 2 seconds.  Neurological:     General: No focal deficit present.     Mental Status: She is alert and oriented to person, place, and time.     Cranial Nerves: No cranial nerve deficit.     Sensory: No sensory deficit.     Comments: Normal extension of fingers and wrist, normal grip strength, normal abduction, abduction, opposition of fingers      ED Treatments / Results  Labs (all labs ordered are listed, but only abnormal results are displayed) Labs Reviewed - No data to display  EKG None  Radiology Dg Humerus Right  Result Date: 04/05/2018 CLINICAL DATA:  Slip and fall tonight. Right upper arm pain  and deformity. EXAM: RIGHT HUMERUS - 2+ VIEW COMPARISON:  None. FINDINGS: Oblique fracture of the midshaft right humerus with lateral angulation of the distal fracture fragments. Soft tissue swelling. No destructive or expansile bone lesions appreciated. IMPRESSION: Oblique fracture midshaft right humerus with mild lateral angulation of the distal fracture fragments. Electronically Signed   By: Burman Nieves M.D.   On: 04/05/2018 02:18    Procedures Procedures (including critical care time)  Medications Ordered in ED Medications  fentaNYL (SUBLIMAZE) injection 50 mcg (50 mcg Intravenous Given 04/05/18 0155)  HYDROmorphone (DILAUDID) injection 1 mg (1 mg Intravenous Given 04/05/18 0300)  ondansetron (ZOFRAN) injection 4 mg (4 mg Intravenous Given 04/05/18 0300)     Initial Impression / Assessment and Plan / ED Course  I have reviewed the triage vital signs and the nursing notes.  Pertinent labs & imaging results that were available during my care of the patient were reviewed by me and considered in my medical decision making (see chart for details).     Patient presents to the emergency department for evaluation of right arm injury.  Patient had a fall earlier tonight onto an outstretched arm.  Patient suffered midshaft fracture of the right humerus.  Fracture is closed, she is neurovascularly intact.  Discussed with Dr. Susa Simmonds, on-call for orthopedics.  Ortho tech did not have the requested splint, therefore patient was placed in a long-arm splint and sling.  She was in a great deal of pain and this did help minimize the pain.  Final Clinical Impressions(s) / ED Diagnoses   Final diagnoses:  Closed nondisplaced oblique fracture of shaft of right humerus, initial encounter    ED Discharge Orders         Ordered    HYDROcodone-acetaminophen (NORCO/VICODIN) 5-325 MG tablet  Every 4 hours PRN     04/05/18 0414           Gilda Crease, MD 04/05/18 [phone]

## 2018-04-07 ENCOUNTER — Encounter: Payer: Self-pay | Admitting: Medical Oncology

## 2018-04-07 ENCOUNTER — Emergency Department
Admission: EM | Admit: 2018-04-07 | Discharge: 2018-04-07 | Disposition: A | Discharge status: Home / Self Care | Payer: Medicaid Other | Attending: Emergency Medicine | Admitting: Emergency Medicine

## 2018-04-07 DIAGNOSIS — S4991XA Unspecified injury of right shoulder and upper arm, initial encounter: Secondary | ICD-10-CM | POA: Diagnosis present

## 2018-04-07 DIAGNOSIS — Y929 Unspecified place or not applicable: Secondary | ICD-10-CM | POA: Diagnosis not present

## 2018-04-07 DIAGNOSIS — Y9301 Activity, walking, marching and hiking: Secondary | ICD-10-CM | POA: Diagnosis not present

## 2018-04-07 DIAGNOSIS — Y999 Unspecified external cause status: Secondary | ICD-10-CM | POA: Diagnosis not present

## 2018-04-07 DIAGNOSIS — Z87891 Personal history of nicotine dependence: Secondary | ICD-10-CM | POA: Diagnosis not present

## 2018-04-07 DIAGNOSIS — S42201A Unspecified fracture of upper end of right humerus, initial encounter for closed fracture: Secondary | ICD-10-CM | POA: Diagnosis not present

## 2018-04-07 DIAGNOSIS — Z79899 Other long term (current) drug therapy: Secondary | ICD-10-CM | POA: Diagnosis not present

## 2018-04-07 DIAGNOSIS — W0110XA Fall on same level from slipping, tripping and stumbling with subsequent striking against unspecified object, initial encounter: Secondary | ICD-10-CM | POA: Diagnosis not present

## 2018-04-07 MED ORDER — BACITRACIN-NEOMYCIN-POLYMYXIN 400-5-5000 EX OINT
TOPICAL_OINTMENT | Freq: Once | CUTANEOUS | Status: AC
  Administered 2018-04-07: 09:00:00 via TOPICAL
  Filled 2018-04-07: qty 1

## 2018-04-07 NOTE — ED Notes (Addendum)
See triage no states she seen on Sunday at [facility] with right arm injury  Was told she had a fx humerus  Was splinted at that time  Now having some swelling  Unable to see f/u ortho until next week  Ace wrap removed  Swelling noted at elbow  Also small abrasion

## 2018-04-07 NOTE — ED Triage Notes (Signed)
Pt reports broken arm Sunday, has splint on but reports fingers swelling. Was told to return for swelling. Pt has good color to hand/fingers, pulse present and sensation good.

## 2018-04-07 NOTE — ED Provider Notes (Signed)
[facility] Emergency Department Provider Note   ____________________________________________   First MD Initiated Contact with Patient 04/07/18 [phone]     (approximate)  I have reviewed the triage vital signs and the nursing notes.   HISTORY  Chief Complaint Arm Pain    HPI Lady DeutscherReba Bryant is a [age] y.o. female patient complain of pain and swelling to right humerus status post splint application 2 days ago.  Patient with diagnosis with a fracture of the right humerus secondary to a slip and fall 2 days ago.  Patient was seen at [facility] emergency room placed in a splint.  Patient is scheduled to see orthopedics next week.  Patient rates the pain as a 5/10.  Patient described pain is "achy".  Patient denies loss of sensation.  Past Medical History:  Diagnosis Date  . Ovarian cyst     There are no active problems to display for this patient.   Past Surgical History:  Procedure Laterality Date  . ABDOMINAL HYSTERECTOMY  2016  . CESAREAN SECTION      Prior to Admission medications   Medication Sig Start Date End Date Taking? Authorizing Provider  HYDROcodone-acetaminophen (NORCO/VICODIN) 5-325 MG tablet Take 1-2 tablets by mouth every 4 (four) hours as needed for moderate pain. 04/05/18   Gilda CreasePollina, Christopher J, MD  Levonorgestrel-Ethinyl Estradiol (AMETHIA,CAMRESE) 0.15-0.03 &0.01 MG tablet Take 1 tablet by mouth at bedtime. 02/11/18   Linzie CollinEvans, David James, MD  ondansetron (ZOFRAN ODT) 4 MG disintegrating tablet Take 1 tablet (4 mg total) by mouth every 8 (eight) hours as needed for nausea or vomiting. Patient not taking: Reported on 04/05/2018 03/02/18   Jene EveryKinner, Robert, MD    Allergies Patient has no known allergies.  Family History  Problem Relation Age of Onset  . Breast cancer Neg Hx   . Ovarian cancer Neg Hx   . Colon cancer Neg Hx     Social History Social History   Tobacco Use  . Smoking status: Former Games developermoker  . Smokeless tobacco: Never  Used  Substance Use Topics  . Alcohol use: Yes    Comment: occas  . Drug use: Never    Review of Systems Constitutional: No fever/chills Eyes: No visual changes. ENT: No sore throat. Cardiovascular: Denies chest pain. Respiratory: Denies shortness of breath. Gastrointestinal: No abdominal pain.  No nausea, no vomiting.  No diarrhea.  No constipation. Genitourinary: Negative for dysuria. Musculoskeletal: Right arm pain.   Skin: Negative for rash.  Abrasion right humerus. Neurological: Negative for headaches, focal weakness or numbness.   ____________________________________________   PHYSICAL EXAM:  VITAL SIGNS: ED Triage Vitals  Enc Vitals Group     BP 04/07/18 0826 114/71     Pulse Rate 04/07/18 0826 89     Resp 04/07/18 0826 18     Temp 04/07/18 0826 98.1 F (36.7 C)     Temp Source 04/07/18 0826 Oral     SpO2 04/07/18 0826 97 %     Weight 04/07/18 0827 143 lb 4.8 oz (65 kg)     Height 04/07/18 0827 5\' 8"  (1.727 m)     Head Circumference --      Peak Flow --      Pain Score 04/07/18 0827 5     Pain Loc --      Pain Edu? --      Excl. in GC? --    Constitutional: Alert and oriented. Well appearing and in no acute distress. Cardiovascular: Normal rate, regular rhythm. Grossly  normal heart sounds.  Good peripheral circulation. Respiratory: Normal respiratory effort.  No retractions. Lungs CTAB. Musculoskeletal: No obvious deformity to the right arm.  Indentation of the skin secondary to splint.   Neurologic:  Normal speech and language. No gross focal neurologic deficits are appreciated. No gait instability. Skin:  Skin is warm, dry and intact. No rash noted.  Abrasion lateral distal upper arm. Psychiatric: Mood and affect are normal. Speech and behavior are normal.  ____________________________________________   LABS (all labs ordered are listed, but only abnormal results are displayed)  Labs Reviewed - No data to  display ____________________________________________  EKG   ____________________________________________  RADIOLOGY  ED MD interpretation:    Official radiology report(s): No results found.  ____________________________________________   PROCEDURES  Procedure(s) performed: None  .Splint Application Date/Time: 04/07/2018 9:03 AM Performed by: Nyoka LintLopez, Tracey D, NT Authorized by: Joni ReiningSmith, Nghia Mcentee K, PA-C   Consent:    Consent obtained:  Verbal   Consent given by:  Parent   Risks discussed:  Numbness, swelling and pain Pre-procedure details:    Sensation:  Normal Procedure details:    Laterality:  Right   Location:  Arm   Arm:  R upper arm   Strapping: no     Cast type:  Long arm   Supplies:  Ortho-Glass and cotton padding Post-procedure details:    Pain:  Unchanged   Sensation:  Normal   Patient tolerance of procedure:  Tolerated well, no immediate complications    Critical Care performed: No  ____________________________________________   INITIAL IMPRESSION / ASSESSMENT AND PLAN / ED COURSE  As part of my medical decision making, I reviewed the following data within the electronic MEDICAL RECORD NUMBER    Right arm pain secondary to pressure from splint.  Abrasion to distal right forearm.  Splint was removed and reapplied for comfort.  Abrasion was clean and Neosporin applied.  Patient advised to follow-up with schedule orthopedic appointment.      ____________________________________________   FINAL CLINICAL IMPRESSION(S) / ED DIAGNOSES  Final diagnoses:  Closed fracture of proximal end of right humerus, unspecified fracture morphology, initial encounter     ED Discharge Orders    None       Note:  This document was prepared using Dragon voice recognition software and may include unintentional dictation errors.    Joni ReiningSmith, Keilly Fatula K, PA-C 04/07/18 [phone]    Minna AntisPaduchowski, Kevin, MD 04/07/18 1435

## 2018-05-14 ENCOUNTER — Encounter: Payer: Medicaid Other | Admitting: Obstetrics and Gynecology

## 2020-03-04 IMAGING — DX DG HUMERUS 2V *R*
2 series · 2 of 2 positions shown · non-contrast
Comparison: None.

CLINICAL DATA: Slip and fall tonight. Right upper arm pain and
deformity.

EXAM:
RIGHT HUMERUS - 2+ VIEW

[humerus ap]
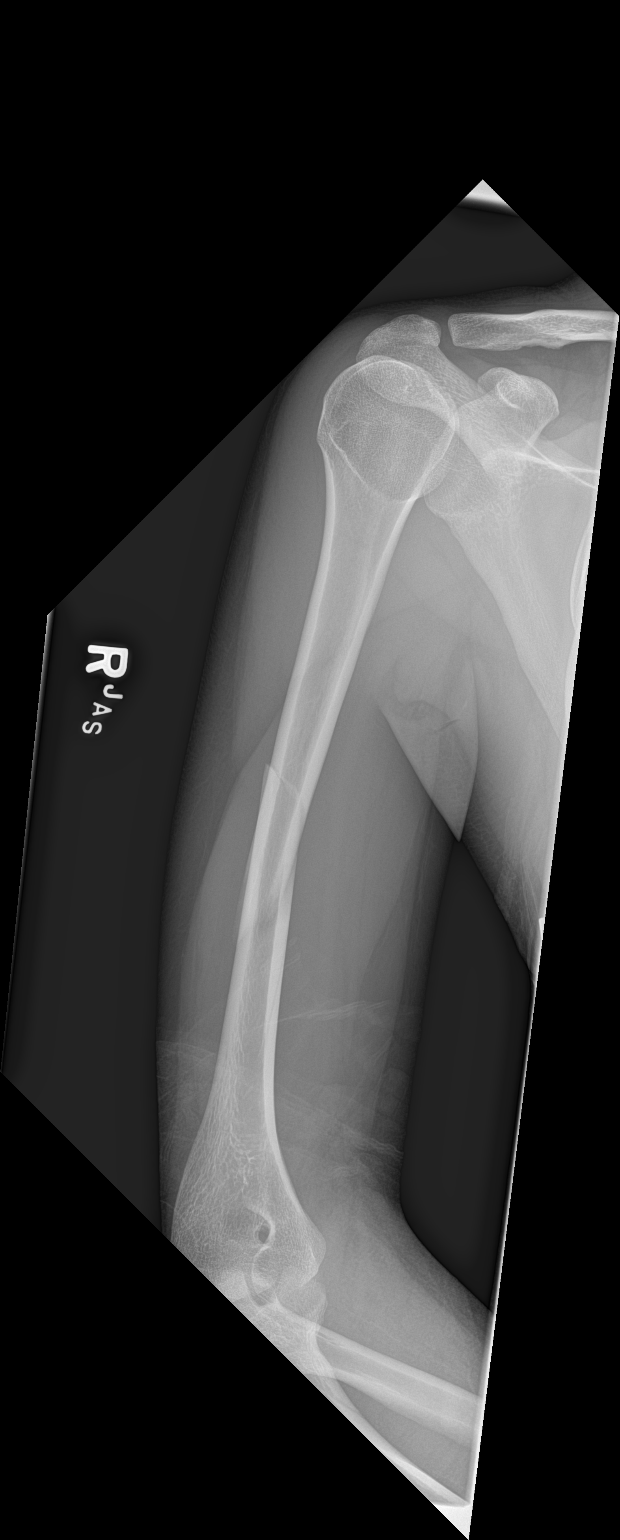

[humerus lat]
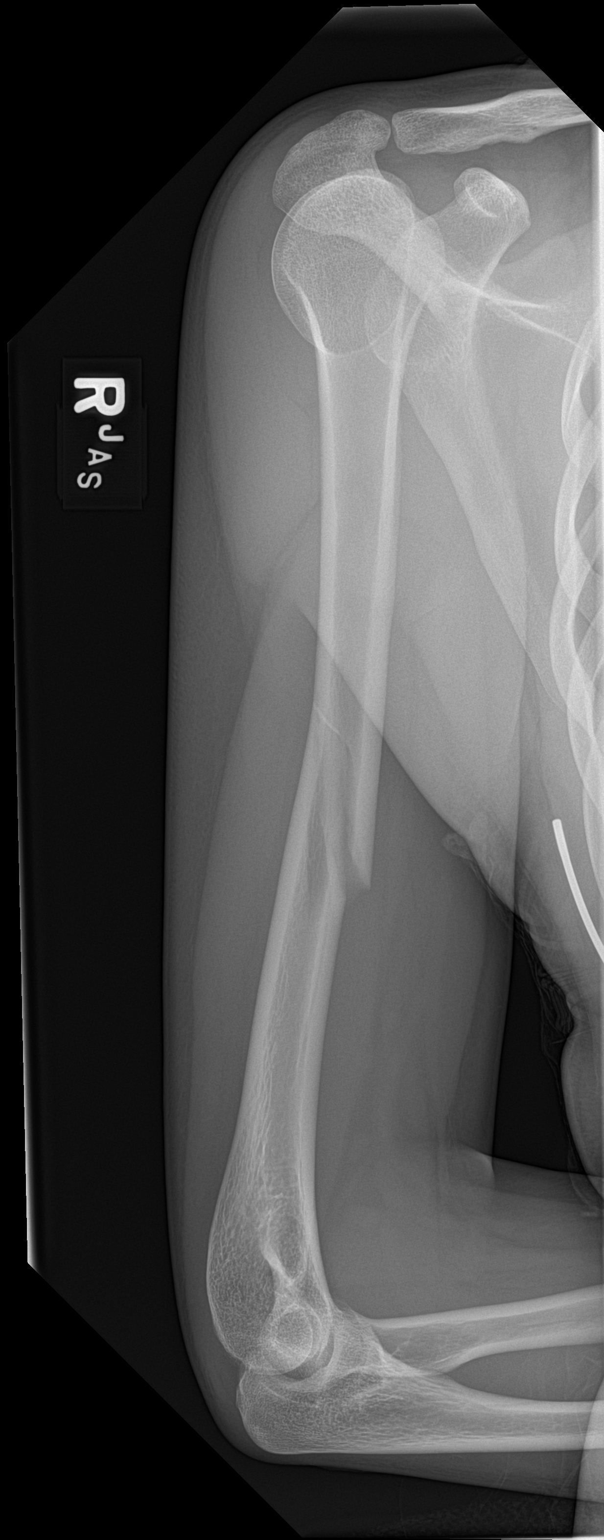

[2 of 2 positions shown; findings below may reference images not displayed]

FINDINGS: Oblique fracture of the midshaft right humerus with lateral
angulation of the distal fracture fragments. Soft tissue swelling.
No destructive or expansile bone lesions appreciated.
IMPRESSION: Oblique fracture midshaft right humerus with mild lateral angulation
of the distal fracture fragments.

## 2020-05-30 IMAGING — US US TRANSVAGINAL NON-OB
1 series · 14 of 25 positions shown · non-contrast
Comparison: None

CLINICAL DATA: Pelvic pain after intercourse



[Series 1: us transvaginal non-ob · 0.20mm/px · 14 of 91 slices shown]
[im 1/91]
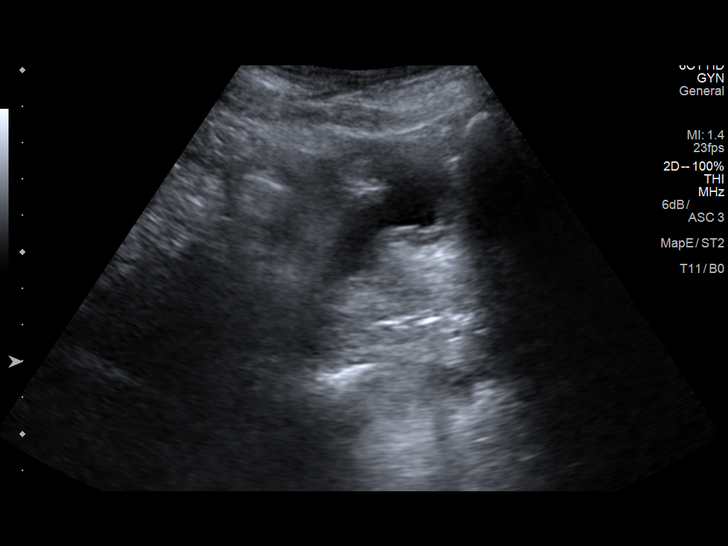
[im 8/91]
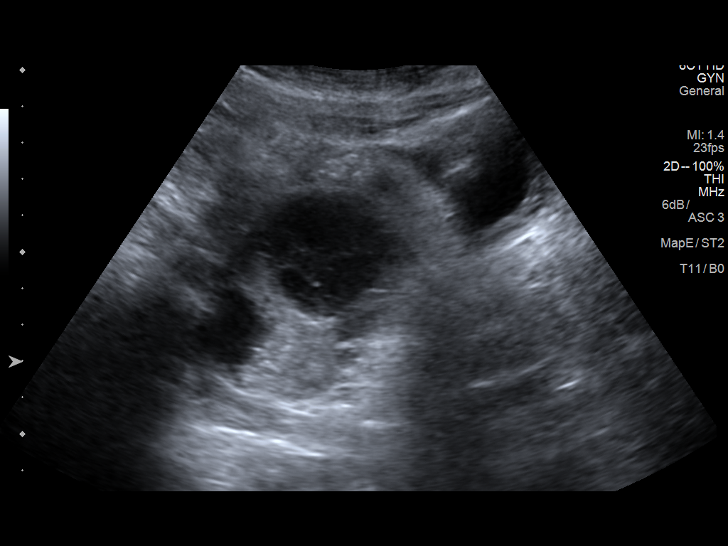
[im 16/91]
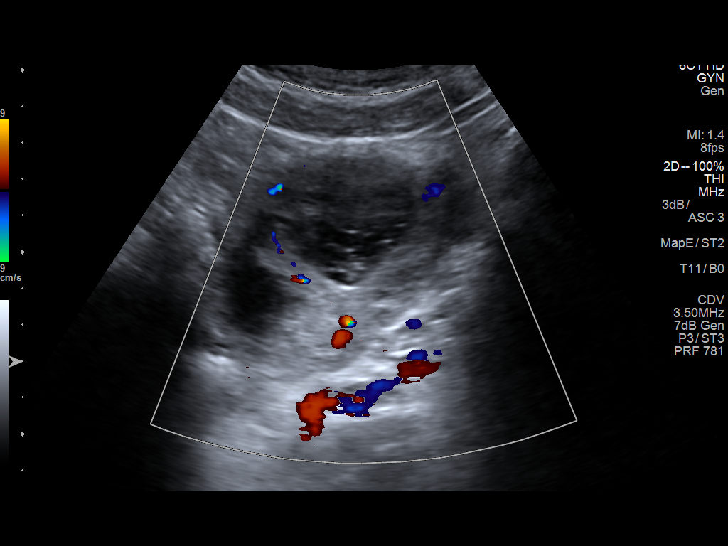
[im 23/91]
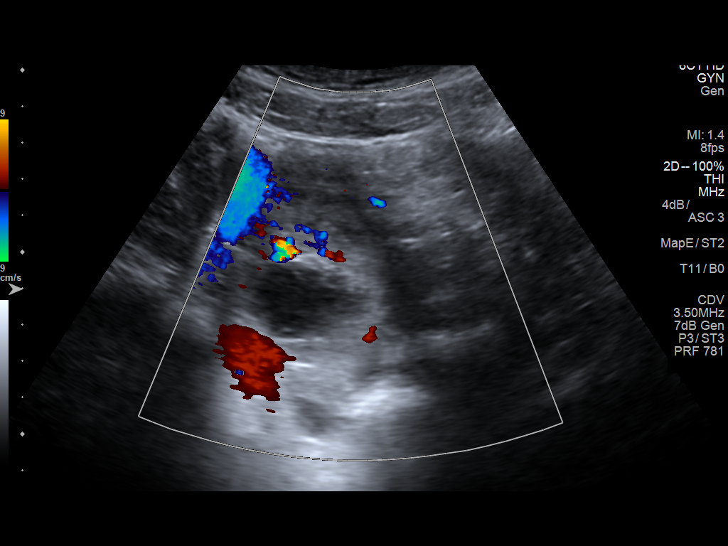
[im 31/91]
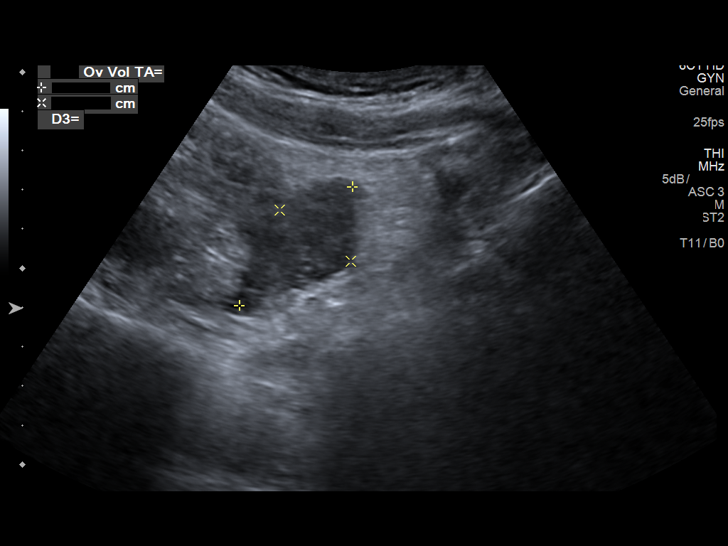
[im 34/91]
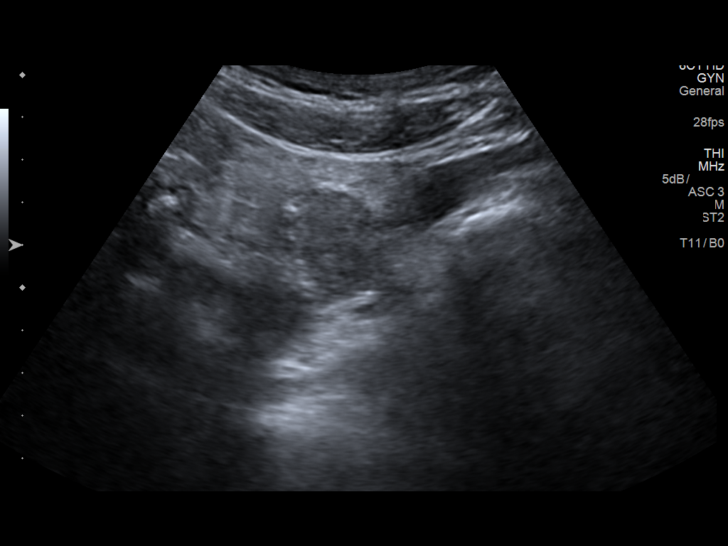
[im 42/91]
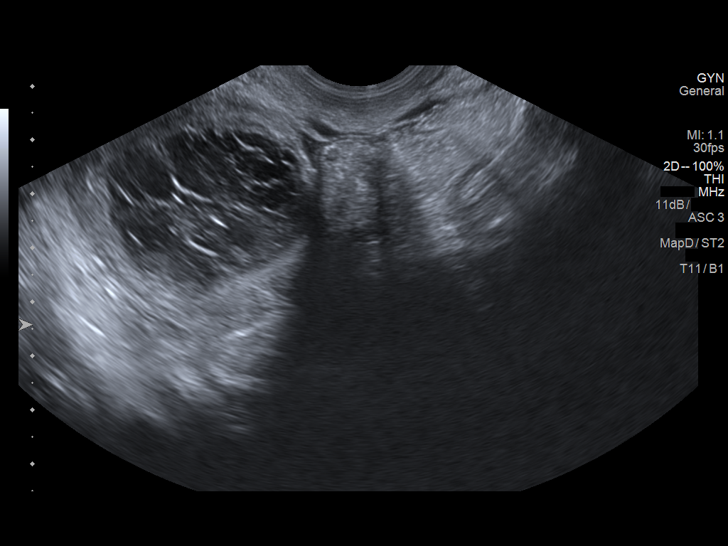
[im 49/91]
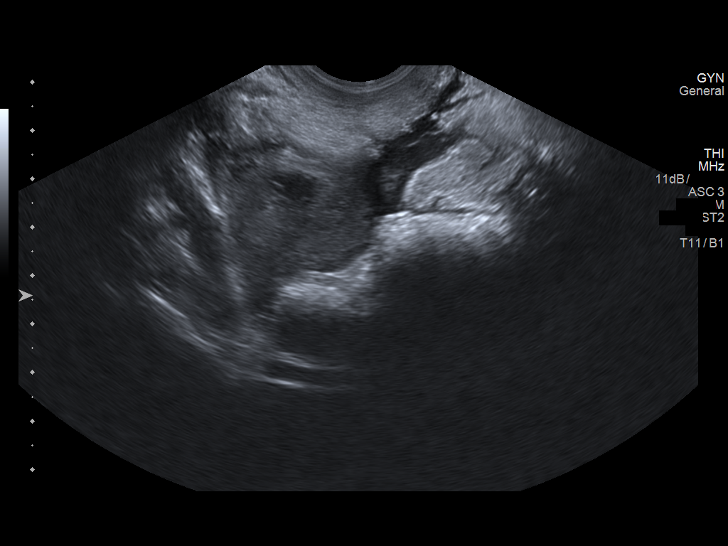
[im 57/91]
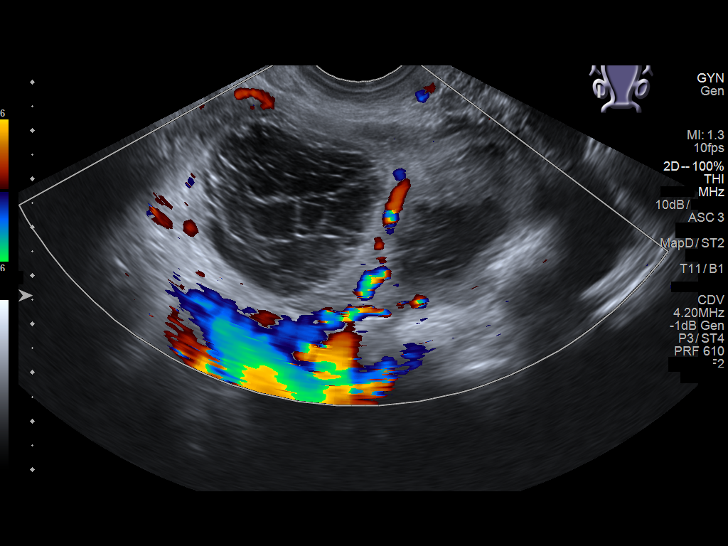
[im 61/91]
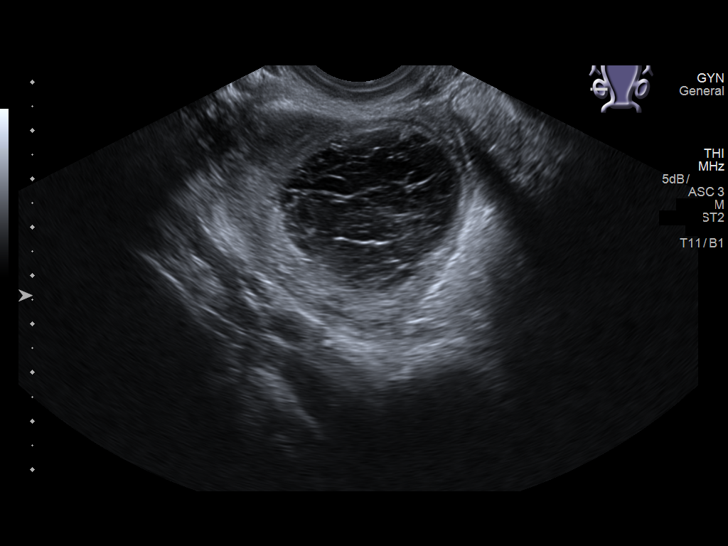
[im 68/91]
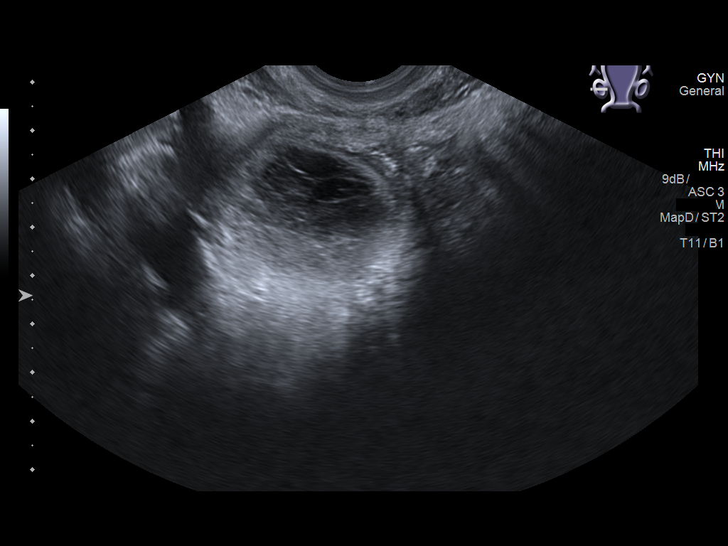
[im 76/91]
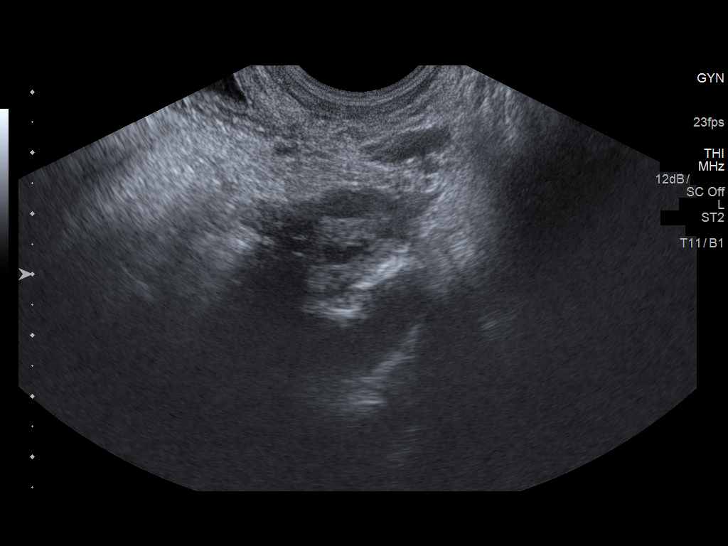
[im 83/91]
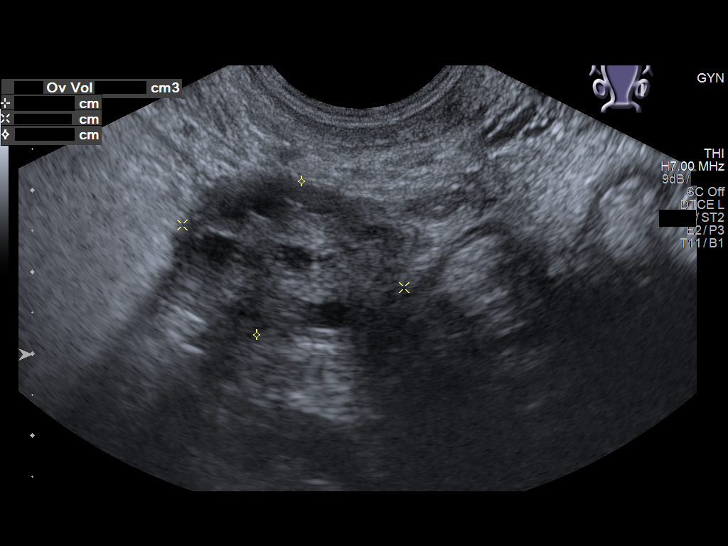
[im 91/91]
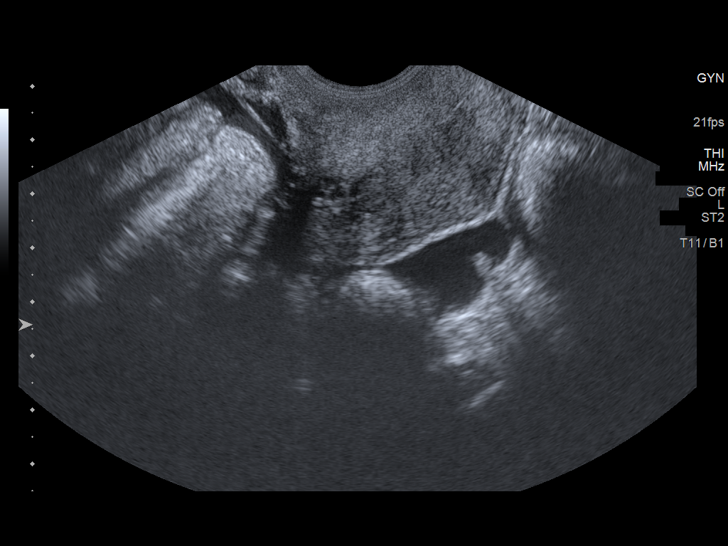

[14 of 25 positions shown; findings below may reference images not displayed]

FINDINGS: Uterus

Status post hysterectomy

Endometrium

Status post hysterectomy

Right ovary

Measurements: 7.1 x 4.6 x 5.1 cm. Cysts in the right ovary measuring
3.9 x 3.6 x 3.7 cm and 3.4 x 2.9 x 3.7 cm. Flow present within the
right ovary.

Left ovary

Measurements: 2.8 x 2 x 2 cm.  Normal appearance/no adnexal mass.

Other findings

Small amount of free fluid in the pelvis
IMPRESSION: 1. Status post hysterectomy
2. Complex cysts in the right ovary measuring up to 3.9 cm, possibly
hemorrhagic. Recommend 6-12 week sonographic follow-up to ensure
resolution
3. Small amount of free fluid in the pelvis
